# Patient Record
Sex: Female | Born: 1995 | Race: Black or African American | Hispanic: No | Marital: Single | Smoking: Never smoker
Health system: Southern US, Community
[De-identification: ages and names within clinical notes are randomized; demographics above are authoritative.]

---

## 2014-02-18 ENCOUNTER — Emergency Department (HOSPITAL_COMMUNITY)
Admission: EM | Admit: 2014-02-18 | Discharge: 2014-02-18 | Disposition: A | Discharge status: Home / Self Care | Payer: No Typology Code available for payment source | Attending: Emergency Medicine | Admitting: Emergency Medicine

## 2014-02-18 ENCOUNTER — Emergency Department (HOSPITAL_COMMUNITY): Payer: No Typology Code available for payment source

## 2014-02-18 ENCOUNTER — Encounter (HOSPITAL_COMMUNITY): Payer: Self-pay | Admitting: Emergency Medicine

## 2014-02-18 DIAGNOSIS — R079 Chest pain, unspecified: Secondary | ICD-10-CM | POA: Diagnosis not present

## 2014-02-18 DIAGNOSIS — F411 Generalized anxiety disorder: Secondary | ICD-10-CM | POA: Insufficient documentation

## 2014-02-18 DIAGNOSIS — R0602 Shortness of breath: Secondary | ICD-10-CM | POA: Insufficient documentation

## 2014-02-18 DIAGNOSIS — R1013 Epigastric pain: Secondary | ICD-10-CM | POA: Diagnosis not present

## 2014-02-18 DIAGNOSIS — F419 Anxiety disorder, unspecified: Secondary | ICD-10-CM

## 2014-02-18 LAB — I-STAT CHEM 8, ED
BUN: 6 mg/dL (ref 6–23)
Calcium, Ion: 1.17 mmol/L (ref 1.12–1.23)
Chloride: 104 mEq/L (ref 96–112)
Creatinine, Ser: 0.7 mg/dL (ref 0.50–1.10)
GLUCOSE: 114 mg/dL — AB (ref 70–99)
HCT: 41 % (ref 36.0–46.0)
HEMOGLOBIN: 13.9 g/dL (ref 12.0–15.0)
POTASSIUM: 3.7 meq/L (ref 3.7–5.3)
Sodium: 140 mEq/L (ref 137–147)
TCO2: 24 mmol/L (ref 0–100)

## 2014-02-18 MED ORDER — ASPIRIN 81 MG PO CHEW
324.0000 mg | CHEWABLE_TABLET | Freq: Once | ORAL | Status: AC
  Administered 2014-02-18: 324 mg via ORAL
  Filled 2014-02-18: qty 4

## 2014-02-18 NOTE — Discharge Instructions (Signed)
Your testing today did not show any signs for concerning or emergent cause of your symptoms. Please followup with your primary care provider for continued evaluation and treatment.    Panic Attacks Panic attacks are sudden, short feelings of great fear or discomfort. You may have them for no reason when you are relaxed, when you are uneasy (anxious), or when you are sleeping.  HOME CARE  Take all your medicines as told.  Check with your doctor before starting new medicines.  Keep all doctor visits. GET HELP IF:  You are not able to take your medicines as told.  Your symptoms do not get better.  Your symptoms get worse. GET HELP RIGHT AWAY IF:  Your attacks seem different than your normal attacks.  You have thoughts about hurting yourself or others.  You take panic attack medicine and you have a side effect. MAKE SURE YOU:  Understand these instructions.  Will watch your condition.  Will get help right away if you are not doing well or get worse. Document Released: 07/26/2010 Document Revised: 04/13/2013 Document Reviewed: 02/04/2013 [facility] Patient Information 2015 [place], [place]. This information is not intended to replace advice given to you by your health care provider. Make sure you discuss any questions you have with your health care provider.

## 2014-02-18 NOTE — ED Provider Notes (Signed)
CSN: [id number]     Arrival date & time 02/18/14  0153 History   First MD Initiated Contact with Patient 02/18/14 0209     Chief Complaint  Patient presents with  . Shortness of Breath    HPI  History provided by the patient. Patient is a 18 year old female with history of anxiety and panic attacks who presents with symptoms of mild abdominal discomfort, shortness of breath with rapid breathing, dizziness and chest tightness. Symptoms first began with slight abdominal discomfort while she was sitting and eating around 11 PM. This was then followed by rapid breathing and shortness of breath, dizziness and left chest tightness. She states that she initially thought this was similar to her panic attacks but her symptoms continued to last much longer than typical. Normally a panic attack but only lasts 30 minutes in after hours she was still having symptoms. She decided to come to emergency room for further evaluation. She denies any near syncope, recent fever, chills or cough. No hemoptysis. No pain or swelling in the extremities. No recent long travel. No prior history of DVT or PE. No estrogen or birth control use.    History reviewed. No pertinent past medical history. History reviewed. No pertinent past surgical history. No family history on file. History  Substance Use Topics  . Smoking status: Never Smoker   . Smokeless tobacco: Not on file  . Alcohol Use: No   OB History   Grav Para Term Preterm Abortions TAB SAB Ect Mult Living                 Review of Systems  Constitutional: Negative for fever, chills and diaphoresis.  Respiratory: Positive for cough. Negative for shortness of breath.   Cardiovascular: Positive for chest pain. Negative for palpitations and leg swelling.  Gastrointestinal: Positive for abdominal pain. Negative for nausea, vomiting, diarrhea and constipation.  Neurological: Positive for dizziness. Negative for syncope and headaches.  All other systems reviewed  and are negative.     Allergies  Acetaminophen  Home Medications   Prior to Admission medications   Not on File   BP 149/85  Pulse 85  Temp(Src) 97.7 F (36.5 C) (Oral)  Resp 16  Ht 5\' 1"  (1.549 m)  Wt 128 lb (58.06 kg)  BMI 24.20 kg/m2  SpO2 99%  LMP 01/18/2014 Physical Exam  Nursing note and vitals reviewed. Constitutional: She is oriented to person, place, and time. She appears well-developed and well-nourished. No distress.  HENT:  Head: Normocephalic.  Cardiovascular: Normal rate and regular rhythm.   No murmur heard. Pulmonary/Chest: Effort normal and breath sounds normal. No respiratory distress. She has no wheezes. She has no rales.  Abdominal: Soft. There is tenderness in the epigastric area. There is no rebound, no guarding, no tenderness at McBurney's point and negative Murphy's sign.  Musculoskeletal: Normal range of motion. She exhibits no edema and no tenderness.  No signs of DVT  Neurological: She is alert and oriented to person, place, and time.  Skin: Skin is warm and dry. No rash noted.  Psychiatric: She has a normal mood and affect. Her behavior is normal.    ED Course  Procedures   COORDINATION OF CARE:  Nursing notes reviewed. Vital signs reviewed. Initial pt interview and examination performed.   Filed Vitals:   02/18/14 0205  BP: 149/85  Pulse: 85  Temp: 97.7 F (36.5 C)  TempSrc: Oral  Resp: 16  Height: 5\' 1"  (1.549 m)  Weight: 128 lb (58.06  kg)  SpO2: 99%    2:17 AM- Patient seen and evaluated. She is calm appears well in no acute distress. Continues to report slight left chest discomfort. Described as tightness. No pleuritic aspect. She is PERC negative.   Unremarkable laboratory testing telemetry EKG and chest x-ray. At this time suspect anxiety and panic attack. Patient is low risk for any ACS or other emergent cause. She may be discharged at this time.  Treatment plan initiated: Medications  aspirin chewable tablet 324 mg  (not administered)    Results for orders placed during the hospital encounter of 02/18/14  I-STAT CHEM 8, ED      Result Value Ref Range   Sodium 140  137 - 147 mEq/L   Potassium 3.7  3.7 - 5.3 mEq/L   Chloride 104  96 - 112 mEq/L   BUN 6  6 - 23 mg/dL   Creatinine, Ser 1.91  0.50 - 1.10 mg/dL   Glucose, Bld 478 (*) 70 - 99 mg/dL   Calcium, Ion 2.95  6.21 - 1.23 mmol/L   TCO2 24  0 - 100 mmol/L   Hemoglobin 13.9  12.0 - 15.0 g/dL   HCT 30.8  65.7 - 84.6 %       Imaging Review Dg Chest 2 View  02/18/2014   CLINICAL DATA:  Chest pain and shortness of breath.  EXAM: CHEST  2 VIEW  COMPARISON:  None.  FINDINGS: The heart size and mediastinal contours are within normal limits. Both lungs are clear. The visualized skeletal structures are unremarkable.  IMPRESSION: No active cardiopulmonary disease.   Electronically Signed   By: Awilda Metro   On: 02/18/2014 03:11     EKG Interpretation None     and and and  Date: 02/18/2014  Rate: 71  Rhythm: normal sinus rhythm  QRS Axis: normal  Intervals: normal  ST/T Wave abnormalities: normal  Conduction Disutrbances:none  Narrative Interpretation:   Old EKG Reviewed: none available and there is      MDM   Final diagnoses:  Anxiety     Angus Seller, PA-C 02/18/14 [phone]

## 2014-02-18 NOTE — ED Notes (Signed)
Pt states @2300  last night began having abd pain, followed by rapid breathing dizziness and shortness of breath and chest tightness. Pt A & O in triage, NAD

## 2014-02-19 NOTE — ED Provider Notes (Signed)
Medical screening examination/treatment/procedure(s) were performed by non-physician practitioner and as supervising physician I was immediately available for consultation/collaboration.   EKG Interpretation None       Derwood KaplanAnkit Jaydon Soroka, MD 02/19/14 0830

## 2014-04-28 ENCOUNTER — Other Ambulatory Visit: Payer: Self-pay | Admitting: Nurse Practitioner

## 2014-04-28 DIAGNOSIS — R109 Unspecified abdominal pain: Secondary | ICD-10-CM

## 2014-04-28 DIAGNOSIS — R1031 Right lower quadrant pain: Secondary | ICD-10-CM

## 2014-05-03 ENCOUNTER — Ambulatory Visit
Admission: RE | Admit: 2014-05-03 | Discharge: 2014-05-03 | Disposition: A | Discharge status: Home / Self Care | Payer: No Typology Code available for payment source | Source: Ambulatory Visit | Attending: Nurse Practitioner | Admitting: Nurse Practitioner

## 2014-05-03 DIAGNOSIS — R109 Unspecified abdominal pain: Secondary | ICD-10-CM

## 2014-05-03 DIAGNOSIS — R1031 Right lower quadrant pain: Secondary | ICD-10-CM

## 2014-09-09 ENCOUNTER — Encounter (HOSPITAL_COMMUNITY): Payer: Self-pay | Admitting: Emergency Medicine

## 2014-09-09 ENCOUNTER — Emergency Department (HOSPITAL_COMMUNITY)
Admission: EM | Admit: 2014-09-09 | Discharge: 2014-09-09 | Disposition: A | Discharge status: Home / Self Care | Payer: No Typology Code available for payment source | Attending: Emergency Medicine | Admitting: Emergency Medicine

## 2014-09-09 DIAGNOSIS — J029 Acute pharyngitis, unspecified: Secondary | ICD-10-CM | POA: Insufficient documentation

## 2014-09-09 DIAGNOSIS — B349 Viral infection, unspecified: Secondary | ICD-10-CM | POA: Insufficient documentation

## 2014-09-09 DIAGNOSIS — R509 Fever, unspecified: Secondary | ICD-10-CM | POA: Diagnosis present

## 2014-09-09 LAB — RAPID STREP SCREEN (MED CTR MEBANE ONLY): Streptococcus, Group A Screen (Direct): NEGATIVE

## 2014-09-09 MED ORDER — IBUPROFEN 200 MG PO TABS
400.0000 mg | ORAL_TABLET | Freq: Once | ORAL | Status: AC
  Administered 2014-09-09: 400 mg via ORAL
  Filled 2014-09-09: qty 2

## 2014-09-09 NOTE — ED Provider Notes (Signed)
CSN: [id number]     Arrival date & time 09/09/14  0242 History   First MD Initiated Contact with Patient 09/09/14 0256     Chief Complaint  Patient presents with  . Fever  . Sore Throat  . 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  She is taken 2 over-the-counter ibuprofen tablets 1 at 1 AM and 1 at 2 AM  for a total of 200 mg without resolution of her fever  Patient is a 19 y.o. female presenting with fever and pharyngitis. The history is provided by the patient.  Fever Temp source:  Subjective Severity:  Moderate Onset quality:  Sudden Duration:  1 day Timing:  Constant Progression:  Unchanged Chronicity:  New Worsened by:  Nothing tried Ineffective treatments:  Ibuprofen Associated symptoms: chills, cough, headaches, myalgias, sore throat and vomiting   Associated symptoms: no chest pain, no congestion, no diarrhea, no dysuria and no rhinorrhea   Cough:    Cough characteristics:  Non-productive   Severity:  Mild   Onset quality:  Sudden Headaches:    Severity:  Mild   Onset quality:  Sudden   Timing:  Intermittent   Progression:  Unchanged Myalgias:    Location:  Generalized   Quality:  Aching   Severity:  Mild   Onset quality:  Sudden Sore throat:    Severity:  Mild   Onset quality:  Sudden   Duration:  1 day   Timing:  Constant Vomiting:    Quality:  Bilious material   Number of occurrences:  1 Sore Throat Associated symptoms include chills, coughing, a fever, headaches, myalgias, a sore throat and vomiting. Pertinent negatives include no chest pain or congestion.    History reviewed. No pertinent past medical history. History reviewed. No pertinent past surgical history. History reviewed. No pertinent family history. History   Substance Use Topics  . Smoking status: Never Smoker   . Smokeless tobacco: Not on file  . Alcohol Use: No   OB History    No data available     Review of Systems  Constitutional: Positive for fever and chills.  HENT: Positive for sore throat. Negative for congestion and rhinorrhea.   Respiratory: Positive for cough. Negative for shortness of breath.   Cardiovascular: Negative for chest pain.  Gastrointestinal: Positive for vomiting. Negative for diarrhea.  Genitourinary: Negative for dysuria and frequency.  Musculoskeletal: Positive for myalgias.  Neurological: Positive for headaches.  All other systems reviewed and are negative.     Allergies  Acetaminophen  Home Medications   Prior to Admission medications   Not on File   BP 141/85 mmHg  Pulse 122  Temp(Src) 98.4 F (36.9 C) (Oral)  Resp 18  SpO2 98%  LMP 07/17/2014 Physical Exam  Constitutional: She appears well-developed and well-nourished.  HENT:  Head: Normocephalic.  Eyes: Pupils are equal, round, and reactive to light.  Neck: Normal range of motion.  Cardiovascular: Normal rate and regular rhythm.   Pulmonary/Chest: Effort normal and breath sounds normal. She exhibits no tenderness.  Abdominal: Soft. She exhibits no distension. There is no tenderness.  Musculoskeletal: Normal range of motion.  Neurological: She is alert.  Skin: Skin is warm. No erythema.  Nursing note and vitals reviewed.   ED Course  Procedures (including critical care time) Labs Review Labs Reviewed  RAPID STREP SCREEN  CULTURE,  GROUP A STREP    Imaging Review No results found.   EKG Interpretation None     Patient given appropriate dose of antipyretic with decrease in temperature  Tolerating PO fliuds  MDM   Final diagnoses:  Viral illness        Arman Filter, NP 09/09/14 2013  Lyanne Co, MD 09/10/14 [phone]

## 2014-09-09 NOTE — Discharge Instructions (Signed)
You can safely take alternating doses of Tylenol and ibuprofen.  Make sure to take the Tylenol that is in tablet form that does not have red dye.  He should not have any allergic reaction.  He may need to do this for several days.  Make sure you get plenty of rest and to plenty of fluids

## 2014-09-09 NOTE — ED Notes (Addendum)
Pt reports since yesterday she has been dizzy and has head along with sore throat. Pt reports vomiting x1. Pt states she took Motrin around 1am and 2am.

## 2014-09-11 LAB — CULTURE, GROUP A STREP: STREP A CULTURE: NEGATIVE

## 2014-09-30 ENCOUNTER — Emergency Department (HOSPITAL_COMMUNITY)
Admission: EM | Admit: 2014-09-30 | Discharge: 2014-09-30 | Disposition: A | Discharge status: Home / Self Care | Payer: No Typology Code available for payment source | Attending: Emergency Medicine | Admitting: Emergency Medicine

## 2014-09-30 ENCOUNTER — Encounter (HOSPITAL_COMMUNITY): Payer: Self-pay | Admitting: Emergency Medicine

## 2014-09-30 DIAGNOSIS — R1110 Vomiting, unspecified: Secondary | ICD-10-CM | POA: Diagnosis present

## 2014-09-30 DIAGNOSIS — K529 Noninfective gastroenteritis and colitis, unspecified: Secondary | ICD-10-CM | POA: Diagnosis not present

## 2014-09-30 DIAGNOSIS — Z3202 Encounter for pregnancy test, result negative: Secondary | ICD-10-CM | POA: Insufficient documentation

## 2014-09-30 LAB — URINALYSIS, ROUTINE W REFLEX MICROSCOPIC
Bilirubin Urine: NEGATIVE
Glucose, UA: NEGATIVE mg/dL
Hgb urine dipstick: NEGATIVE
KETONES UR: NEGATIVE mg/dL
Leukocytes, UA: NEGATIVE
Nitrite: NEGATIVE
PH: 8.5 — AB (ref 5.0–8.0)
Protein, ur: 30 mg/dL — AB
Specific Gravity, Urine: 1.023 (ref 1.005–1.030)
UROBILINOGEN UA: 0.2 mg/dL (ref 0.0–1.0)

## 2014-09-30 LAB — CBC WITH DIFFERENTIAL/PLATELET
BASOS PCT: 0 % (ref 0–1)
Basophils Absolute: 0 10*3/uL (ref 0.0–0.1)
Eosinophils Absolute: 0.1 10*3/uL (ref 0.0–0.7)
Eosinophils Relative: 1 % (ref 0–5)
HCT: 39.7 % (ref 36.0–46.0)
HEMOGLOBIN: 13.1 g/dL (ref 12.0–15.0)
LYMPHS PCT: 20 % (ref 12–46)
Lymphs Abs: 1.4 10*3/uL (ref 0.7–4.0)
MCH: 29.9 pg (ref 26.0–34.0)
MCHC: 33 g/dL (ref 30.0–36.0)
MCV: 90.6 fL (ref 78.0–100.0)
Monocytes Absolute: 0.4 10*3/uL (ref 0.1–1.0)
Monocytes Relative: 5 % (ref 3–12)
Neutro Abs: 5.3 10*3/uL (ref 1.7–7.7)
Neutrophils Relative %: 74 % (ref 43–77)
PLATELETS: 308 10*3/uL (ref 150–400)
RBC: 4.38 MIL/uL (ref 3.87–5.11)
RDW: 12.1 % (ref 11.5–15.5)
WBC: 7.2 10*3/uL (ref 4.0–10.5)

## 2014-09-30 LAB — URINE MICROSCOPIC-ADD ON

## 2014-09-30 LAB — LIPASE, BLOOD: Lipase: 17 U/L (ref 11–59)

## 2014-09-30 LAB — COMPREHENSIVE METABOLIC PANEL
ALT: 18 U/L (ref 0–35)
AST: 28 U/L (ref 0–37)
Albumin: 4.9 g/dL (ref 3.5–5.2)
Alkaline Phosphatase: 45 U/L (ref 39–117)
Anion gap: 10 (ref 5–15)
BUN: 9 mg/dL (ref 6–23)
CALCIUM: 9.4 mg/dL (ref 8.4–10.5)
CHLORIDE: 105 mmol/L (ref 96–112)
CO2: 22 mmol/L (ref 19–32)
Creatinine, Ser: 0.67 mg/dL (ref 0.50–1.10)
GFR calc non Af Amer: >90 mL/min (ref >90)
Glucose, Bld: 93 mg/dL (ref 70–99)
Potassium: 3.6 mmol/L (ref 3.5–5.1)
Sodium: 137 mmol/L (ref 135–145)
Total Bilirubin: 0.6 mg/dL (ref 0.3–1.2)
Total Protein: 8.3 g/dL (ref 6.0–8.3)

## 2014-09-30 LAB — POC URINE PREG, ED: Preg Test, Ur: NEGATIVE

## 2014-09-30 MED ORDER — ONDANSETRON 8 MG PO TBDP: 8.0000 mg | ORAL_TABLET | Freq: Once | ORAL | Status: DC

## 2014-09-30 MED ORDER — ONDANSETRON HCL 4 MG/2ML IJ SOLN
4.0000 mg | Freq: Once | INTRAMUSCULAR | Status: AC
  Administered 2014-09-30: 4 mg via INTRAVENOUS
  Filled 2014-09-30: qty 2

## 2014-09-30 MED ORDER — SODIUM CHLORIDE 0.9 % IV BOLUS (SEPSIS)
1000.0000 mL | Freq: Once | INTRAVENOUS | Status: AC
  Administered 2014-09-30: 1000 mL via INTRAVENOUS

## 2014-09-30 MED ORDER — PROMETHAZINE HCL 25 MG PO TABS: 25.0000 mg | ORAL_TABLET | Freq: Three times a day (TID) | ORAL | Status: DC | PRN

## 2014-09-30 MED ORDER — ONDANSETRON HCL 4 MG/2ML IJ SOLN: 4.0000 mg | Freq: Once | INTRAMUSCULAR | Status: DC

## 2014-09-30 NOTE — ED Notes (Addendum)
Pt reports emesis with what appeared to be dark red blood that started this am. Pt adds that she is having diarrhea. Pt denies dysuria or fever. Pt is A&O and in NAD. Pt adds that she just got over flu/PNA and was given abd 2 weeks ago

## 2014-09-30 NOTE — ED Notes (Signed)
Pt alert, oriented, and ambulatory upon DC,. She was advised to follow up with PCP or campus MD. She leaves with prescription in hand.

## 2014-09-30 NOTE — Discharge Instructions (Signed)
Return here as needed.  Follow-up with your primary care doctor °

## 2014-09-30 NOTE — ED Notes (Signed)
Patient notified urine sample is needed, unable to go at the moment

## 2014-09-30 NOTE — ED Notes (Signed)
PT ambulated to restroom.

## 2014-09-30 NOTE — ED Provider Notes (Signed)
CSN: [id number]     Arrival date & time 09/30/14  1202 History   First MD Initiated Contact with Patient 09/30/14 1216     Chief Complaint  Patient presents with  . Emesis  . Diarrhea     (Consider location/radiation/quality/duration/timing/severity/associated sxs/prior Treatment) HPI Patient presents to the emergency department with nausea, vomiting and diarrhea that started this morning.  The patient states that she has not been running low and that is sick that she knows of.  The patient denies chest pain, shortness breath, weakness, dizziness, headache, blurred vision, back pain, neck pain, dysuria, fever, rash, cough, or syncope.  The patient states that she did not take any medications at home.  She denies that after drinking some water that she had some specks of blood mixed in her vomit.  Patient states nothing seems make her condition better or worse History reviewed. No pertinent past medical history. History reviewed. No pertinent past surgical history. No family history on file. History  Substance Use Topics  . Smoking status: Never Smoker   . Smokeless tobacco: Not on file  . Alcohol Use: No   OB History    No data available     Review of Systems  All other systems negative except as documented in the HPI. All pertinent positives and negatives as reviewed in the HPI.  Allergies  Acetaminophen  Home Medications   Prior to Admission medications   Medication Sig Start Date End Date Taking? Authorizing Provider  benzonatate (TESSALON) 200 MG capsule Take 200 mg by mouth 3 (three) times daily as needed for cough.   Yes Historical Provider, MD   BP 133/71 mmHg  Pulse 90  Temp(Src) 97.6 F (36.4 C) (Oral)  Resp 16  SpO2 96%  LMP 09/15/2014 Physical Exam  Constitutional: She is oriented to person, place, and time. She appears well-developed and well-nourished. No distress.  HENT:  Head: Normocephalic and atraumatic.  Mouth/Throat: Oropharynx is clear and moist.   Eyes: Pupils are equal, round, and reactive to light.  Neck: Normal range of motion. Neck supple.  Cardiovascular: Normal rate, regular rhythm and normal heart sounds.  Exam reveals no gallop and no friction rub.   No murmur heard. Pulmonary/Chest: Effort normal and breath sounds normal. No respiratory distress.  Abdominal: Soft. Bowel sounds are normal. She exhibits no distension. There is no tenderness. There is no rebound and no guarding.  Musculoskeletal: She exhibits no edema.  Neurological: She is alert and oriented to person, place, and time. She exhibits normal muscle tone. Coordination normal.  Skin: Skin is warm and dry. No rash noted. No erythema.  Nursing note and vitals reviewed.   ED Course  Procedures (including critical care time) Labs Review Labs Reviewed  URINALYSIS, ROUTINE W REFLEX MICROSCOPIC - Abnormal; Notable for the following:    pH 8.5 (*)    Protein, ur 30 (*)    All other components within normal limits  CBC WITH DIFFERENTIAL/PLATELET  COMPREHENSIVE METABOLIC PANEL  LIPASE, BLOOD  URINE MICROSCOPIC-ADD ON  POC URINE PREG, ED    Patient is feeling better at this time, we will give her oral fluid trial was likely discharge her home with gastroenteritis.  Told to slowly increase her fluid intake.  Return here for any worsening in her condition    Charlestine NightChristopher Eldin Bonsell, PA-C 09/30/14 1456  Glynn OctaveStephen Rancour, MD 09/30/14 [phone]

## 2015-05-20 ENCOUNTER — Emergency Department (HOSPITAL_COMMUNITY)
Admission: EM | Admit: 2015-05-20 | Discharge: 2015-05-20 | Disposition: A | Discharge status: Home / Self Care | Payer: No Typology Code available for payment source | Attending: Emergency Medicine | Admitting: Emergency Medicine

## 2015-05-20 ENCOUNTER — Encounter (HOSPITAL_COMMUNITY): Payer: Self-pay | Admitting: Emergency Medicine

## 2015-05-20 DIAGNOSIS — Z3202 Encounter for pregnancy test, result negative: Secondary | ICD-10-CM | POA: Insufficient documentation

## 2015-05-20 DIAGNOSIS — A084 Viral intestinal infection, unspecified: Secondary | ICD-10-CM | POA: Diagnosis not present

## 2015-05-20 DIAGNOSIS — R1084 Generalized abdominal pain: Secondary | ICD-10-CM | POA: Diagnosis present

## 2015-05-20 LAB — CBC
HEMATOCRIT: 39.2 % (ref 36.0–46.0)
HEMOGLOBIN: 13.4 g/dL (ref 12.0–15.0)
MCH: 30.2 pg (ref 26.0–34.0)
MCHC: 34.2 g/dL (ref 30.0–36.0)
MCV: 88.5 fL (ref 78.0–100.0)
Platelets: 288 10*3/uL (ref 150–400)
RBC: 4.43 MIL/uL (ref 3.87–5.11)
RDW: 11.7 % (ref 11.5–15.5)
WBC: 7.2 10*3/uL (ref 4.0–10.5)

## 2015-05-20 LAB — URINALYSIS, ROUTINE W REFLEX MICROSCOPIC
Bilirubin Urine: NEGATIVE
Glucose, UA: NEGATIVE mg/dL
Ketones, ur: NEGATIVE mg/dL
Nitrite: NEGATIVE
PROTEIN: NEGATIVE mg/dL
Specific Gravity, Urine: 1.031 — ABNORMAL HIGH (ref 1.005–1.030)
UROBILINOGEN UA: 0.2 mg/dL (ref 0.0–1.0)
pH: 6 (ref 5.0–8.0)

## 2015-05-20 LAB — COMPREHENSIVE METABOLIC PANEL
ALT: 18 U/L (ref 14–54)
AST: 26 U/L (ref 15–41)
Albumin: 4.6 g/dL (ref 3.5–5.0)
Alkaline Phosphatase: 42 U/L (ref 38–126)
Anion gap: 10 (ref 5–15)
BUN: 12 mg/dL (ref 6–20)
CALCIUM: 9.8 mg/dL (ref 8.9–10.3)
CO2: 23 mmol/L (ref 22–32)
Chloride: 106 mmol/L (ref 101–111)
Creatinine, Ser: 0.61 mg/dL (ref 0.44–1.00)
Glucose, Bld: 96 mg/dL (ref 65–99)
POTASSIUM: 3.5 mmol/L (ref 3.5–5.1)
Sodium: 139 mmol/L (ref 135–145)
TOTAL PROTEIN: 8.5 g/dL — AB (ref 6.5–8.1)
Total Bilirubin: 0.4 mg/dL (ref 0.3–1.2)

## 2015-05-20 LAB — LIPASE, BLOOD: LIPASE: 28 U/L (ref 11–51)

## 2015-05-20 LAB — POC URINE PREG, ED: PREG TEST UR: NEGATIVE

## 2015-05-20 LAB — URINE MICROSCOPIC-ADD ON

## 2015-05-20 MED ORDER — ONDANSETRON HCL 4 MG PO TABS: 4.0000 mg | ORAL_TABLET | Freq: Four times a day (QID) | ORAL | Status: AC

## 2015-05-20 MED ORDER — ONDANSETRON HCL 4 MG/2ML IJ SOLN
4.0000 mg | Freq: Once | INTRAMUSCULAR | Status: AC
  Administered 2015-05-20: 4 mg via INTRAVENOUS
  Filled 2015-05-20: qty 2

## 2015-05-20 MED ORDER — DICYCLOMINE HCL 20 MG PO TABS: 20.0000 mg | ORAL_TABLET | Freq: Two times a day (BID) | ORAL | Status: AC

## 2015-05-20 MED ORDER — SODIUM CHLORIDE 0.9 % IV BOLUS (SEPSIS)
1000.0000 mL | Freq: Once | INTRAVENOUS | Status: AC
Start: 2015-05-20 — End: 2015-05-20
  Administered 2015-05-20: 1000 mL via INTRAVENOUS

## 2015-05-20 MED ORDER — DICYCLOMINE HCL 10 MG/ML IM SOLN
20.0000 mg | Freq: Once | INTRAMUSCULAR | Status: AC
  Administered 2015-05-20: 20 mg via INTRAMUSCULAR
  Filled 2015-05-20: qty 2

## 2015-05-20 MED ORDER — KETOROLAC TROMETHAMINE 30 MG/ML IJ SOLN
30.0000 mg | Freq: Once | INTRAMUSCULAR | Status: AC
  Administered 2015-05-20: 30 mg via INTRAVENOUS
  Filled 2015-05-20: qty 1

## 2015-05-20 MED ORDER — LACTINEX PO PACK: PACK | ORAL | Status: AC

## 2015-05-20 NOTE — ED Notes (Signed)
She has drank some ginger ale and tolerated it quite well.  She states "I feel a lot better".

## 2015-05-20 NOTE — ED Notes (Signed)
Pt from home c/o generalized abdominal pain x 30 min-1 hr. She reports nausea but denies vomiting. Denies urinary symptoms.

## 2015-05-20 NOTE — Discharge Instructions (Signed)
Avoid fatty foods, fried foods, greasy foods, and milk products until symptoms resolve. Take Zofran as prescribed for nausea/vomiting. Take ibuprofen or Tylenol for pain. Also recommend the use of Lactinex as prescribed for diarrhea. Follow-up with a primary care doctor for further evaluation of symptoms. Return to the emergency department as needed if symptoms worsen.  Viral Gastroenteritis Viral gastroenteritis is also known as stomach flu. This condition affects the stomach and intestinal tract. It can cause sudden diarrhea and vomiting. The illness typically lasts 3 to 8 days. Most people develop an immune response that eventually gets rid of the virus. While this natural response develops, the virus can make you quite ill. CAUSES  Many different viruses can cause gastroenteritis, such as rotavirus or noroviruses. You can catch one of these viruses by consuming contaminated food or water. You may also catch a virus by sharing utensils or other personal items with an infected person or by touching a contaminated surface. SYMPTOMS  The most common symptoms are diarrhea and vomiting. These problems can cause a severe loss of body fluids (dehydration) and a body salt (electrolyte) imbalance. Other symptoms may include:  Fever.  Headache.  Fatigue.  Abdominal pain. DIAGNOSIS  Your caregiver can usually diagnose viral gastroenteritis based on your symptoms and a physical exam. A stool sample may also be taken to test for the presence of viruses or other infections. TREATMENT  This illness typically goes away on its own. Treatments are aimed at rehydration. The most serious cases of viral gastroenteritis involve vomiting so severely that you are not able to keep fluids down. In these cases, fluids must be given through an intravenous line (IV). HOME CARE INSTRUCTIONS   Drink enough fluids to keep your urine clear or pale yellow. Drink small amounts of fluids frequently and increase the amounts as  tolerated.  Ask your caregiver for specific rehydration instructions.  Avoid:  Foods high in sugar.  Alcohol.  Carbonated drinks.  Tobacco.  Juice.  Caffeine drinks.  Extremely hot or cold fluids.  Fatty, greasy foods.  Too much intake of anything at one time.  Dairy products until 24 to 48 hours after diarrhea stops.  You may consume probiotics. Probiotics are active cultures of beneficial bacteria. They may lessen the amount and number of diarrheal stools in adults. Probiotics can be found in yogurt with active cultures and in supplements.  Wash your hands well to avoid spreading the virus.  Only take over-the-counter or prescription medicines for pain, discomfort, or fever as directed by your caregiver. Do not give aspirin to children. Antidiarrheal medicines are not recommended.  Ask your caregiver if you should continue to take your regular prescribed and over-the-counter medicines.  Keep all follow-up appointments as directed by your caregiver. SEEK IMMEDIATE MEDICAL CARE IF:   You are unable to keep fluids down.  You do not urinate at least once every 6 to 8 hours.  You develop shortness of breath.  You notice blood in your stool or vomit. This may look like coffee grounds.  You have abdominal pain that increases or is concentrated in one small area (localized).  You have persistent vomiting or diarrhea.  You have a fever.  The patient is a child younger than 3 months, and he or she has a fever.  The patient is a child older than 3 months, and he or she has a fever and persistent symptoms.  The patient is a child older than 3 months, and he or she has a fever  and symptoms suddenly get worse.  The patient is a baby, and he or she has no tears when crying. MAKE SURE YOU:   Understand these instructions.  Will watch your condition.  Will get help right away if you are not doing well or get worse.   This information is not intended to replace  advice given to you by your health care provider. Make sure you discuss any questions you have with your health care provider.   Document Released: 06/23/2005 Document Revised: 09/15/2011 Document Reviewed: 04/09/2011 Elsevier Interactive Patient Education [employer]

## 2015-05-20 NOTE — ED Notes (Signed)
She is awake and tells us she feels "better".  She has no requests at this time.

## 2015-05-20 NOTE — ED Provider Notes (Signed)
CSN: [id number]     Arrival date & time 05/20/15  0404 History   First MD Initiated Contact with Patient 05/20/15 0453     Chief Complaint  Patient presents with  . Abdominal Pain    (Consider location/radiation/quality/duration/timing/severity/associated sxs/prior Treatment) HPI Comments: Patient is a 19 year old female who presents to the emergency department for further evaluation of abdominal pain which began 1.5 hours ago, waking her from sleep. Pain is constant, diffuse and sharp/cramping. Patient reports associated nausea with 1 episode of watery diarrhea. No medications taken prior to arrival for symptoms. Patient denies known sick contacts. Patient denies fever, vomiting, dysuria, vaginal bleeding, vaginal discharge, and history of abdominal surgeries. Last menstrual period was 1 week ago.  Patient is a 19 y.o. female presenting with abdominal pain. The history is provided by the patient. No language interpreter was used.  Abdominal Pain Associated symptoms: diarrhea and nausea   Associated symptoms: no chest pain, no dysuria, no fever, no shortness of breath, no vaginal bleeding, no vaginal discharge and no vomiting     History reviewed. No pertinent past medical history. History reviewed. No pertinent past surgical history. No family history on file. Social History  Substance Use Topics  . Smoking status: Never Smoker   . Smokeless tobacco: None  . Alcohol Use: No   OB History    No data available      Review of Systems  Constitutional: Negative for fever.  Respiratory: Negative for shortness of breath.   Cardiovascular: Negative for chest pain.  Gastrointestinal: Positive for nausea, abdominal pain and diarrhea. Negative for vomiting.  Genitourinary: Negative for dysuria, vaginal bleeding and vaginal discharge.  All other systems reviewed and are negative.   Allergies  Acetaminophen and Red dye  Home Medications   Prior to Admission medications   Medication  Sig Start Date End Date Taking? Authorizing Provider  Lactobacillus (LACTINEX) PACK Mix 1/2 packet with soft food. Take every 12 hours for 5 days. 05/20/15   Antony Madura, PA-C  ondansetron (ZOFRAN) 4 MG tablet Take 1 tablet (4 mg total) by mouth every 6 (six) hours. 05/20/15   Antony Madura, PA-C   BP 147/85 mmHg  Pulse 69  Temp(Src) 98.3 F (36.8 C) (Oral)  Resp 14  SpO2 99%  LMP 05/14/2015   Physical Exam  Constitutional: She is oriented to person, place, and time. She appears well-developed and well-nourished. No distress.  Nontoxic/nonseptic appearing  HENT:  Head: Normocephalic and atraumatic.  Eyes: Conjunctivae and EOM are normal. No scleral icterus.  Neck: Normal range of motion.  Cardiovascular: Normal rate, regular rhythm and intact distal pulses.   Pulmonary/Chest: Effort normal. No respiratory distress. She has no wheezes. She has no rales.  Respirations even and unlabored  Abdominal: Soft. She exhibits no distension. There is tenderness. There is no rebound and no guarding.  Diffuse tenderness to palpation. No focal tenderness. No rebound or masses. No peritoneal signs.  Musculoskeletal: Normal range of motion.  Neurological: She is alert and oriented to person, place, and time. She exhibits normal muscle tone. Coordination normal.  Skin: Skin is warm and dry. No rash noted. She is not diaphoretic. No erythema. No pallor.  Psychiatric: She has a normal mood and affect. Her behavior is normal.  Nursing note and vitals reviewed.   ED Course  Procedures (including critical care time) Labs Review Labs Reviewed  COMPREHENSIVE METABOLIC PANEL - Abnormal; Notable for the following:    Total Protein 8.5 (*)    All other components  within normal limits  URINALYSIS, ROUTINE W REFLEX MICROSCOPIC (NOT AT [facility]) - Abnormal; Notable for the following:    Color, Urine AMBER (*)    APPearance CLOUDY (*)    Specific Gravity, Urine 1.031 (*)    Hgb urine dipstick MODERATE (*)     Leukocytes, UA SMALL (*)    All other components within normal limits  URINE MICROSCOPIC-ADD ON - Abnormal; Notable for the following:    Squamous Epithelial / LPF FEW (*)    All other components within normal limits  URINE CULTURE  LIPASE, BLOOD  CBC  POC URINE PREG, ED    Imaging Review No results found.   I have personally reviewed and evaluated these images and lab results as part of my medical decision-making.   EKG Interpretation None      MDM   Final diagnoses:  Viral gastroenteritis    Patient with symptoms consistent with viral gastroenteritis. Abdominal pain awoke patient from sleep with associated nausea and diarrhea. Vitals are stable, no fever. Lungs are clear. No focal abdominal pain or focal TTP at McBurney's point. Doubt appendicitis, cholecystitis, pancreatitis, ruptured viscus, UTI, kidney stone, or other emergent abdominal etiology. Abdominal reexamination improved; no TTP on repeat exam after tx with Toradol and Bentyl. Supportive therapy indicated with return if symptoms worsen. Anticipate discharge of patient able to tolerate PO fluids without emesis. Burna FortsJeff Hedges, PA-C to discharge if patient able to tolerate POs.   Filed Vitals:   05/20/15 0409 05/20/15 0604  BP: 134/79 147/85  Pulse: 79 69  Temp: 97.9 F (36.6 C) 98.3 F (36.8 C)  TempSrc: Oral Oral  Resp: 18 14  SpO2: 98% 99%     Antony MaduraKelly Mazikeen Hehn, PA-C 05/20/15 [id number]  April Palumbo, MD 05/20/15 [phone]

## 2015-05-22 LAB — URINE CULTURE

## 2015-06-12 ENCOUNTER — Emergency Department (HOSPITAL_COMMUNITY)
Admission: EM | Admit: 2015-06-12 | Discharge: 2015-06-12 | Disposition: A | Discharge status: Home / Self Care | Payer: No Typology Code available for payment source | Attending: Emergency Medicine | Admitting: Emergency Medicine

## 2015-06-12 ENCOUNTER — Encounter (HOSPITAL_COMMUNITY): Payer: Self-pay | Admitting: Emergency Medicine

## 2015-06-12 DIAGNOSIS — R197 Diarrhea, unspecified: Secondary | ICD-10-CM | POA: Insufficient documentation

## 2015-06-12 DIAGNOSIS — Z79899 Other long term (current) drug therapy: Secondary | ICD-10-CM | POA: Insufficient documentation

## 2015-06-12 DIAGNOSIS — R112 Nausea with vomiting, unspecified: Secondary | ICD-10-CM | POA: Insufficient documentation

## 2015-06-12 DIAGNOSIS — Z3202 Encounter for pregnancy test, result negative: Secondary | ICD-10-CM | POA: Insufficient documentation

## 2015-06-12 LAB — URINALYSIS, ROUTINE W REFLEX MICROSCOPIC
Bilirubin Urine: NEGATIVE
GLUCOSE, UA: NEGATIVE mg/dL
Hgb urine dipstick: NEGATIVE
KETONES UR: NEGATIVE mg/dL
LEUKOCYTES UA: NEGATIVE
NITRITE: NEGATIVE
PH: 7 (ref 5.0–8.0)
Protein, ur: NEGATIVE mg/dL
SPECIFIC GRAVITY, URINE: 1.01 (ref 1.005–1.030)

## 2015-06-12 LAB — POC URINE PREG, ED: Preg Test, Ur: NEGATIVE

## 2015-06-12 MED ORDER — LOPERAMIDE HCL 2 MG PO CAPS
4.0000 mg | ORAL_CAPSULE | Freq: Once | ORAL | Status: AC
  Administered 2015-06-12: 4 mg via ORAL
  Filled 2015-06-12: qty 2

## 2015-06-12 MED ORDER — ONDANSETRON 4 MG PO TBDP: 4.0000 mg | ORAL_TABLET | Freq: Three times a day (TID) | ORAL | Status: AC | PRN

## 2015-06-12 MED ORDER — ONDANSETRON 4 MG PO TBDP
4.0000 mg | ORAL_TABLET | Freq: Once | ORAL | Status: AC
  Administered 2015-06-12: 4 mg via ORAL
  Filled 2015-06-12: qty 1

## 2015-06-12 MED ORDER — LOPERAMIDE HCL 2 MG PO CAPS: 2.0000 mg | ORAL_CAPSULE | ORAL | Status: AC | PRN

## 2015-06-12 NOTE — ED Notes (Signed)
Pt ambulating independently w/ steady gait on d/c in no acute distress, A&Ox4. D/c instructions reviewed w/ pt - pt denies any further questions or concerns at present. Rx given x2  

## 2015-06-12 NOTE — Discharge Instructions (Signed)

## 2015-06-12 NOTE — ED Provider Notes (Signed)
CSN: [id number]     Arrival date & time 06/12/15  0445 History   First MD Initiated Contact with Patient 06/12/15 [phone]     Chief Complaint  Patient presents with  . Abdominal Pain     (Consider location/radiation/quality/duration/timing/severity/associated sxs/prior Treatment) HPI  This is a 19 year old female who presents with upper abdominal cramping, diarrhea and nausea. Onset of symptoms yesterday evening. Reports that she woke up and has had 2 episodes of diarrhea. She reports crampy upper abdominal pain that comes and goes. Currently her pain is 5 out of 10. She endorses nausea without vomiting. No known sick contacts. No fevers. Last menstrual period was November 7. She is not taking anything at home for her pain. Abdominal cramping gets worse prior to her going to the bathroom.  History reviewed. No pertinent past medical history. History reviewed. No pertinent past surgical history. No family history on file. Social History  Substance Use Topics  . Smoking status: Never Smoker   . Smokeless tobacco: None  . Alcohol Use: No   OB History    No data available     Review of Systems  Constitutional: Negative for fever.  Respiratory: Negative for chest tightness and shortness of breath.   Cardiovascular: Negative for chest pain.  Gastrointestinal: Positive for nausea, abdominal pain and diarrhea. Negative for vomiting.  Genitourinary: Negative for dysuria.  All other systems reviewed and are negative.     Allergies  Acetaminophen and Red dye  Home Medications   Prior to Admission medications   Medication Sig Start Date End Date Taking? Authorizing Provider  ondansetron (ZOFRAN) 4 MG tablet Take 1 tablet (4 mg total) by mouth every 6 (six) hours. 05/20/15  Yes Antony MaduraKelly Humes, PA-C  dicyclomine (BENTYL) 20 MG tablet Take 1 tablet (20 mg total) by mouth 2 (two) times daily. Patient not taking: Reported on 06/12/2015 05/20/15   Antony MaduraKelly Humes, PA-C  Lactobacillus (LACTINEX)  PACK Mix 1/2 packet with soft food. Take every 12 hours for 5 days. Patient not taking: Reported on 06/12/2015 05/20/15   Antony MaduraKelly Humes, PA-C  loperamide (IMODIUM) 2 MG capsule Take 1 capsule (2 mg total) by mouth as needed for diarrhea or loose stools. 06/12/15   Shon Batonourtney F Horton, MD  ondansetron (ZOFRAN-ODT) 4 MG disintegrating tablet Take 1 tablet (4 mg total) by mouth every 8 (eight) hours as needed for nausea or vomiting. 06/12/15   Shon Batonourtney F Horton, MD   BP 148/100 mmHg  Pulse 105  Temp(Src) 98.2 F (36.8 C) (Oral)  Resp 18  Ht 5\' 1"  (1.549 m)  Wt 115 lb (52.164 kg)  BMI 21.74 kg/m2  SpO2 100%  LMP 05/14/2015 Physical Exam  Constitutional: She is oriented to person, place, and time. She appears well-developed and well-nourished. No distress.  HENT:  Head: Normocephalic and atraumatic.  Mucous membranes moist  Cardiovascular: Normal rate and regular rhythm.   No murmur heard. Pulmonary/Chest: Effort normal and breath sounds normal. No respiratory distress. She has no wheezes.  Abdominal: Soft. Bowel sounds are normal. There is no tenderness. There is no rebound and no guarding.  Neurological: She is alert and oriented to person, place, and time.  Skin: Skin is warm and dry.  Psychiatric: She has a normal mood and affect.  Nursing note and vitals reviewed.   ED Course  Procedures (including critical care time) Labs Review Labs Reviewed  URINALYSIS, ROUTINE W REFLEX MICROSCOPIC (NOT AT [facility])  POC URINE PREG, ED    Imaging Review No results found.  I have personally reviewed and evaluated these images and lab results as part of my medical decision-making.   EKG Interpretation None      MDM   Final diagnoses:  Nausea vomiting and diarrhea    Patient presents with self-limited diarrhea, nausea, and crampy abdominal pain. Nontoxic on exam. Abdominal pain is normal without reproducible tenderness. Vital signs are otherwise reassuring. Patient was given Imodium and  Zofran. Urinalysis without evidence of ketones. Urine pregnancy is negative. On recheck, patient is tolerating fluids and reports she feels better. Do not feel she needs further workup at this time and she is nontender on exam. Suspect enteritis/gastroenteritis. Will discharge with Zofran and Imodium.  After history, exam, and medical workup I feel the patient has been appropriately medically screened and is safe for discharge home. Pertinent diagnoses were discussed with the patient. Patient was given return precautions.     Shon Baton, MD 06/12/15 [phone]

## 2015-06-12 NOTE — ED Notes (Addendum)
Pt c/o upper and lower mid abd pain onset yesterday with 2 episodes of diarrhea, + nausea. Denies urinary and gyn s/s

## 2015-08-29 ENCOUNTER — Emergency Department (HOSPITAL_COMMUNITY)
Admission: EM | Admit: 2015-08-29 | Discharge: 2015-08-29 | Disposition: A | Discharge status: Home / Self Care | Payer: No Typology Code available for payment source | Attending: Emergency Medicine | Admitting: Emergency Medicine

## 2015-08-29 ENCOUNTER — Encounter (HOSPITAL_COMMUNITY): Payer: Self-pay

## 2015-08-29 DIAGNOSIS — R110 Nausea: Secondary | ICD-10-CM | POA: Insufficient documentation

## 2015-08-29 DIAGNOSIS — R109 Unspecified abdominal pain: Secondary | ICD-10-CM | POA: Insufficient documentation

## 2015-08-29 LAB — URINE MICROSCOPIC-ADD ON

## 2015-08-29 LAB — URINALYSIS, ROUTINE W REFLEX MICROSCOPIC
BILIRUBIN URINE: NEGATIVE
Glucose, UA: NEGATIVE mg/dL
Hgb urine dipstick: NEGATIVE
KETONES UR: NEGATIVE mg/dL
NITRITE: NEGATIVE
PH: 6 (ref 5.0–8.0)
Protein, ur: NEGATIVE mg/dL
SPECIFIC GRAVITY, URINE: 1.02 (ref 1.005–1.030)

## 2015-08-29 NOTE — ED Notes (Signed)
Called pt for labs -  Was told by reception that she left.

## 2015-08-29 NOTE — ED Notes (Signed)
Pt notified Registration, did not want to wait.  D/C chart

## 2015-08-29 NOTE — ED Notes (Signed)
Pt woke up this morning with abdominal pain and nausea.  No vomiting.  No fever.  No discharge.  No change in urination

## 2016-06-11 IMAGING — US US PELVIS COMPLETE
1 series · 14 of 25 positions shown · non-contrast
Comparison: None.

CLINICAL DATA: Right lower quadrant pain x4 days

EXAM:
TRANSABDOMINAL ULTRASOUND OF PELVIS
TECHNIQUE: Transabdominal ultrasound examination of the pelvis was performed
including evaluation of the uterus, ovaries, adnexal regions, and
pelvic cul-de-sac.

[Series 1: us pelvis complete · 0.22mm/px · 14 of 28 slices shown]
[im 1/28]
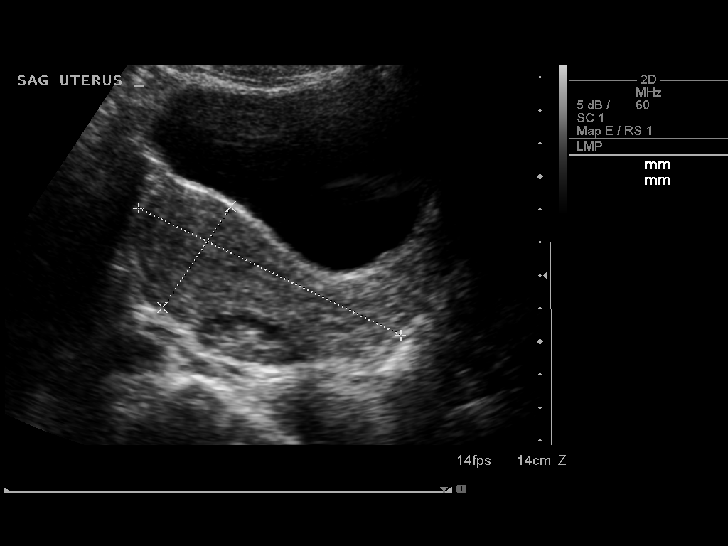
[im 3/28]
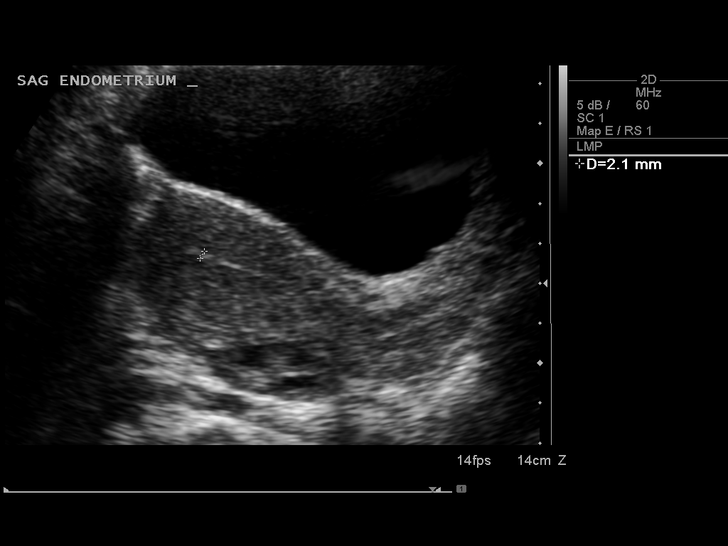
[im 5/28]
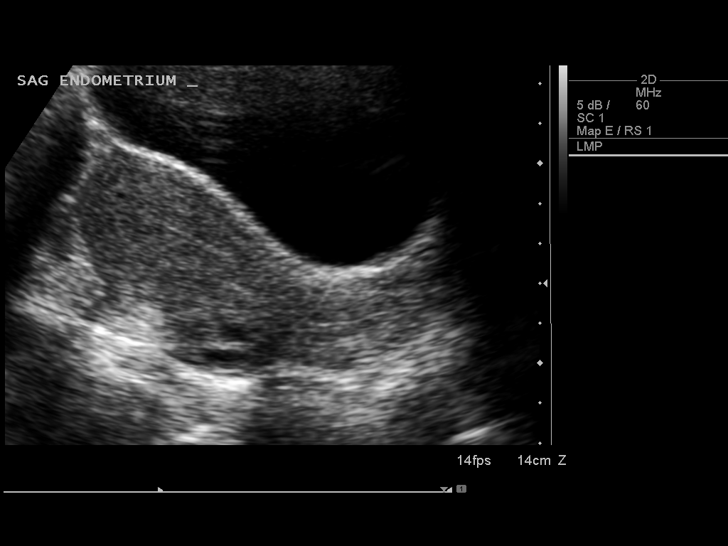
[im 7/28]
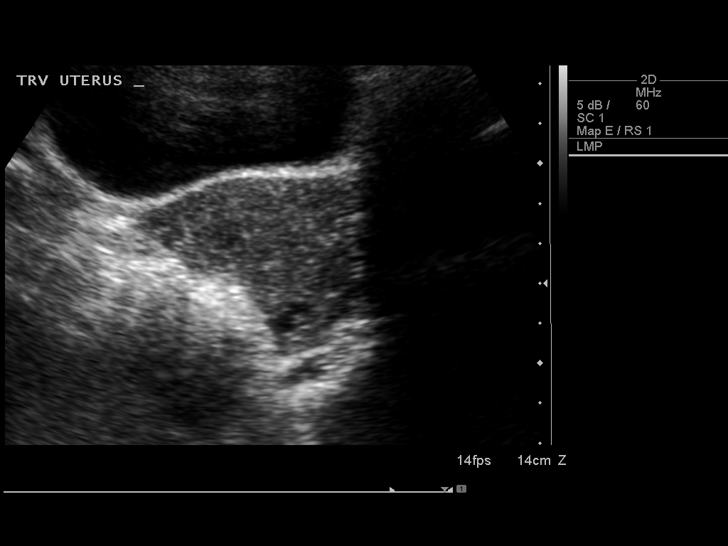
[im 10/28]
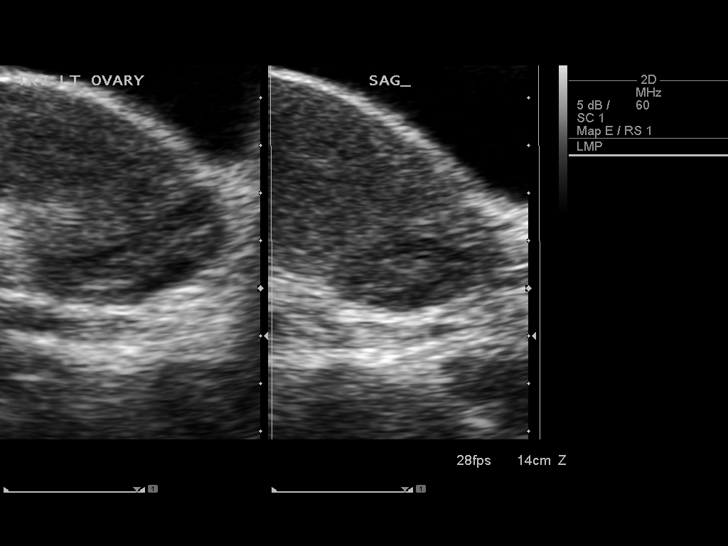
[im 11/28]
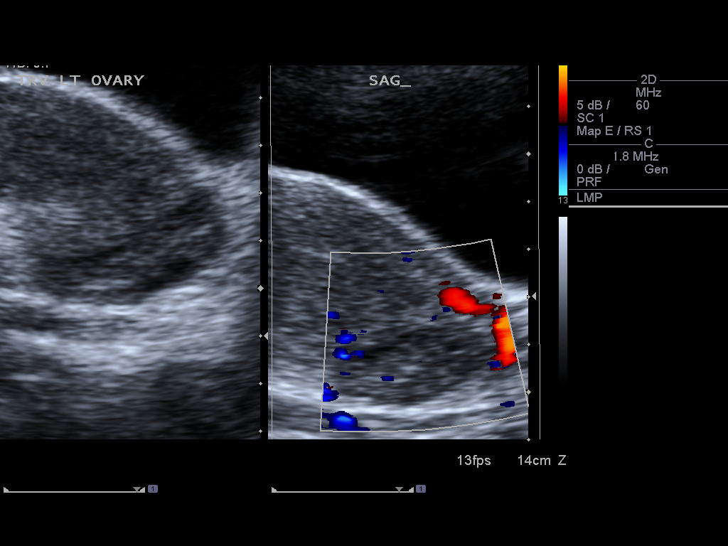
[im 13/28]
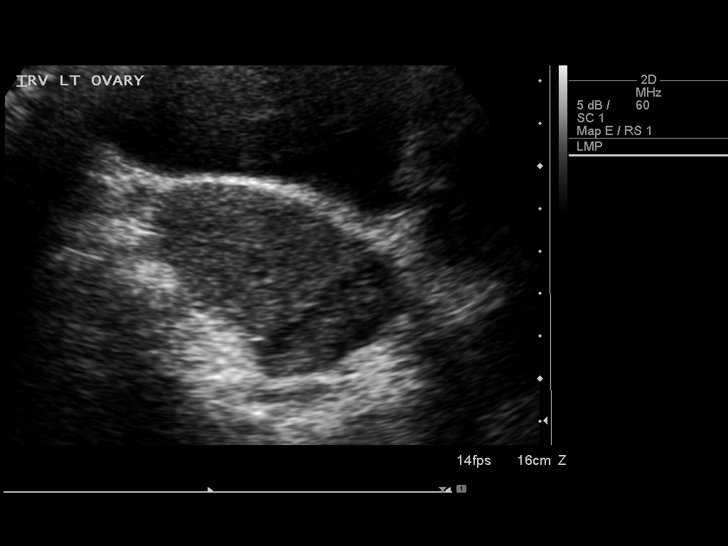
[im 15/28]
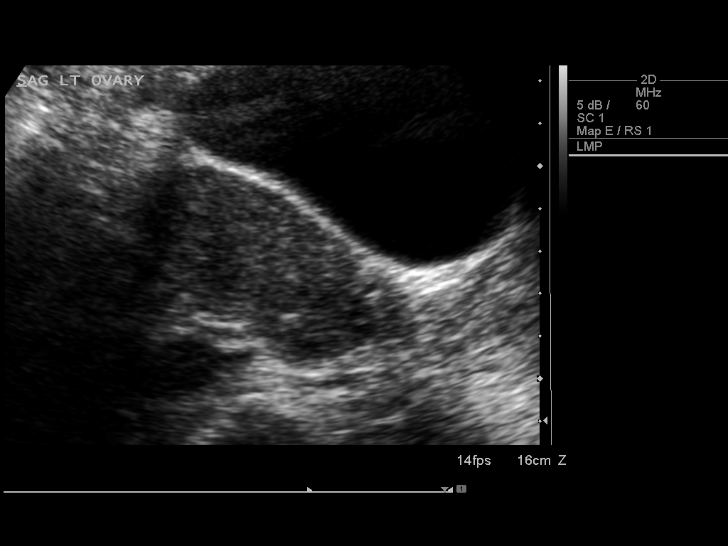
[im 17/28]
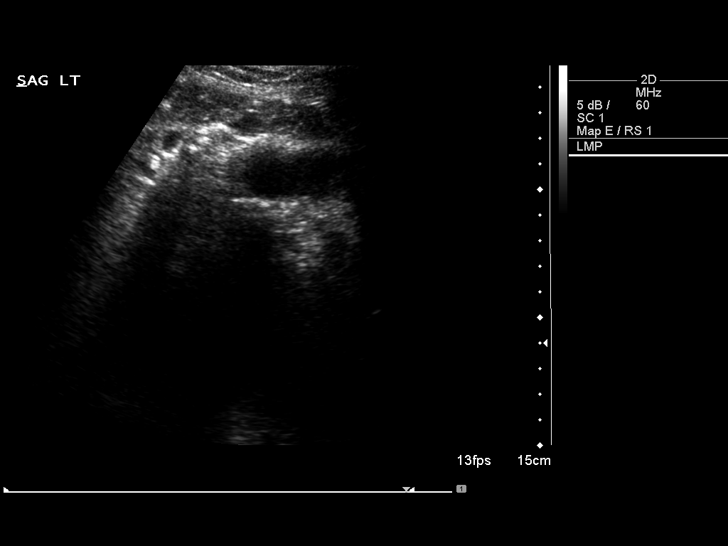
[im 19/28]
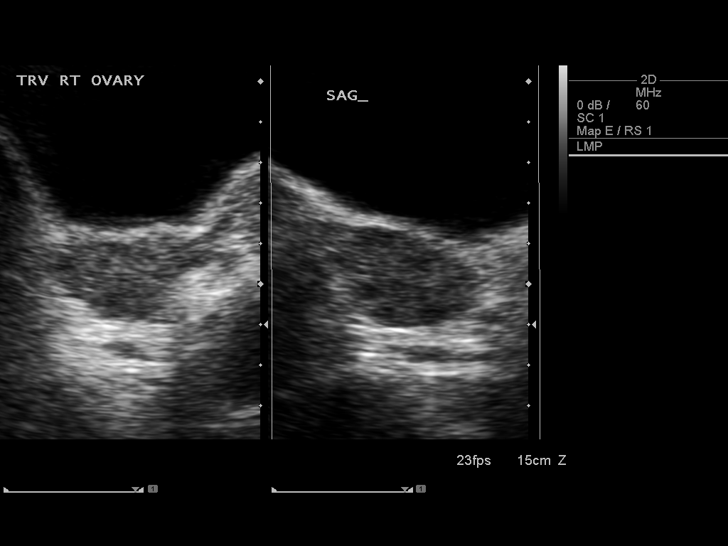
[im 21/28]
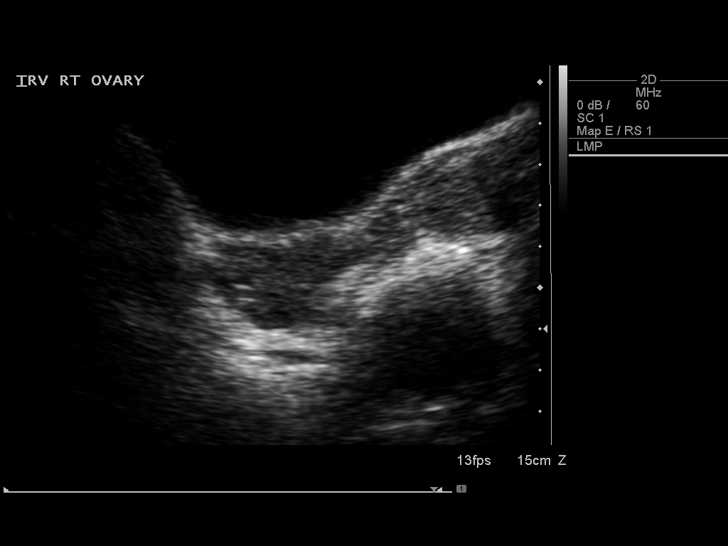
[im 23/28]
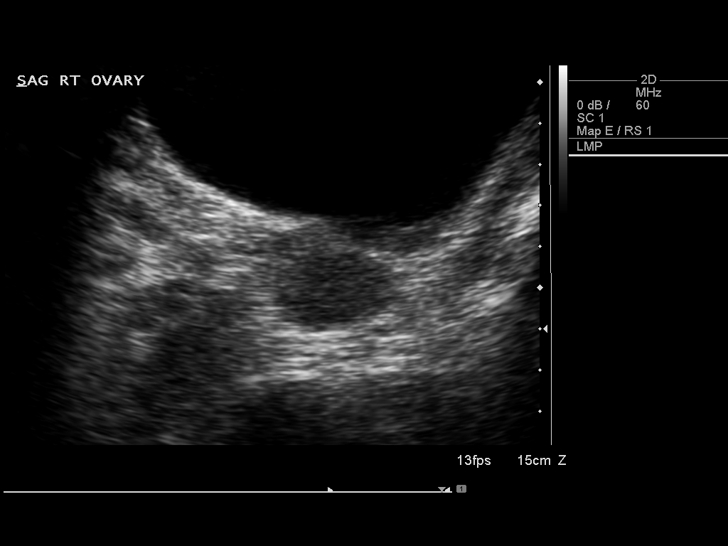
[im 25/28]
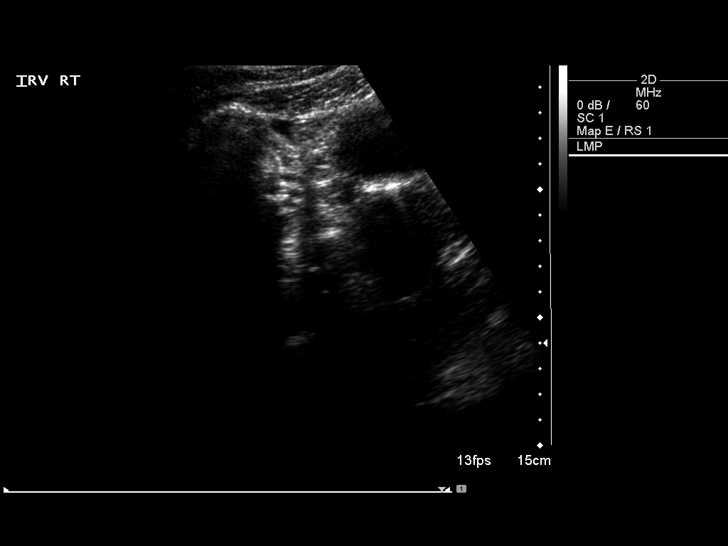
[im 28/28]
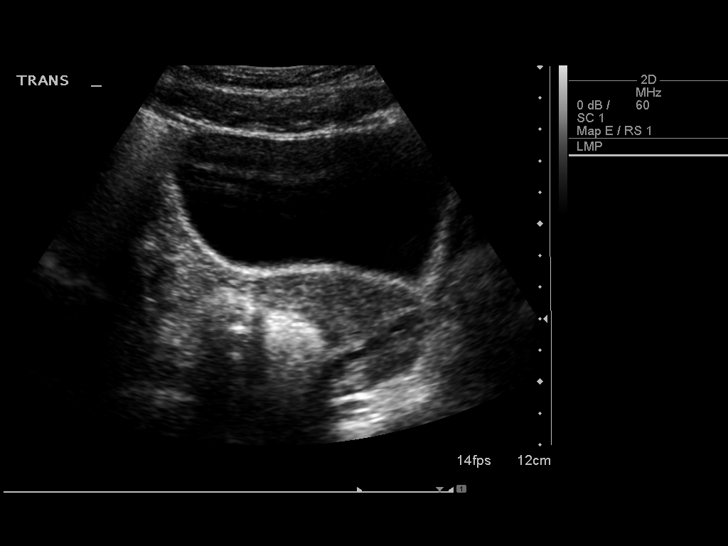

[14 of 25 positions shown; findings below may reference images not displayed]

FINDINGS: Uterus

Measurements: 8.8 x 3.7 x 5.1 cm. No fibroids or other mass
visualized.

Endometrium

Thickness: 2 mm.  No focal abnormality visualized.

Right ovary

Measurements: 3.6 x 2.1 x 3.9 cm. Normal appearance/no adnexal mass.

Left ovary

Measurements: 4.2 x 1.4 x 3.2 cm. Normal appearance/no adnexal mass.

Other findings:  No free fluid
IMPRESSION: Negative pelvic ultrasound.
